# Patient Record
Sex: Male | Born: 1985 | Race: Black or African American | Hispanic: No | Marital: Single | Smoking: Current every day smoker
Health system: Southern US, Community
[De-identification: ages and names within clinical notes are randomized; demographics above are authoritative.]

## PROBLEM LIST (undated history)

## (undated) DIAGNOSIS — K219 Gastro-esophageal reflux disease without esophagitis: Secondary | ICD-10-CM

## (undated) HISTORY — DX: Gastro-esophageal reflux disease without esophagitis: K21.9

## (undated) HISTORY — PX: APPENDECTOMY: SHX54

---

## 2015-08-15 ENCOUNTER — Encounter (HOSPITAL_COMMUNITY): Payer: Self-pay | Admitting: Emergency Medicine

## 2015-08-15 ENCOUNTER — Ambulatory Visit (HOSPITAL_COMMUNITY)
Admission: EM | Admit: 2015-08-15 | Discharge: 2015-08-15 | Disposition: A | Discharge status: Home / Self Care | Payer: Self-pay | Attending: Emergency Medicine | Admitting: Emergency Medicine

## 2015-08-15 DIAGNOSIS — L0291 Cutaneous abscess, unspecified: Secondary | ICD-10-CM

## 2015-08-15 MED ORDER — SULFAMETHOXAZOLE-TRIMETHOPRIM 800-160 MG PO TABS: 1.0000 | ORAL_TABLET | Freq: Two times a day (BID) | ORAL | Status: AC

## 2015-08-15 NOTE — ED Notes (Signed)
Right calf with a bump, visible white top to bump, redness around bump.  Bump present for 4 days.

## 2015-08-15 NOTE — ED Provider Notes (Signed)
CSN: [id number]     Arrival date & time 08/15/15  1738 History   First MD Initiated Contact with Patient 08/15/15 1835     Chief Complaint  Patient presents with  . Abscess   (Consider location/radiation/quality/duration/timing/severity/associated sxs/prior Treatment) HPI History obtained from patient: 30 year old male states that he noted a red sore area on his right calf today. Concerned that he was bitten by a spider. Patient states that he works in [employer].. Currently has no pain. Patient describes the discomfort as a dull ache and it is constantly present. No home treatments. Associated fever or chills. Family history of hypertension.       History reviewed. No pertinent past medical history. Past Surgical History  Procedure Laterality Date  . Appendectomy     Family History  Problem Relation Age of Onset  . Hypertension Mother    Social History  Substance Use Topics  . Smoking status: Current Every Day Smoker  . Smokeless tobacco: None  . Alcohol Use: Yes    Review of Systems  Denies: HEADACHE, NAUSEA, ABDOMINAL PAIN, CHEST PAIN, CONGESTION, DYSURIA, SHORTNESS OF BREATH  Allergies  Review of patient's allergies indicates no known allergies.  Home Medications   Prior to Admission medications   Medication Sig Start Date End Date Taking? Authorizing Provider  sulfamethoxazole-trimethoprim (BACTRIM DS,SEPTRA DS) 800-160 MG tablet Take 1 tablet by mouth 2 (two) times daily. 08/15/15 08/22/15  Tharon AquasFrank C Charma Mocarski, PA   Meds Ordered and Administered this Visit  Medications - No data to display  BP 125/76 mmHg  Pulse 97  Temp(Src) 98.1 F (36.7 C) (Oral)  Resp 16  SpO2 99% No data found.   Physical Exam NURSES NOTES AND VITAL SIGNS REVIEWED. CONSTITUTIONAL: Well developed, well nourished, no acute distress HEENT: normocephalic, atraumatic EYES: Conjunctiva normal NECK:normal ROM, supple, no adenopathy PULMONARY:No respiratory distress, normal effort ABDOMINAL:  Soft, ND, NT BS+, No CVAT MUSCULOSKELETAL: Normal ROM of all extremities, Right calf is a small red 1 cm round lesion with a white head. It is nontender and there is no induration. SKIN: warm and dry without rash PSYCHIATRIC: Mood and affect, behavior are normal   ED Course  Procedures (including critical care time)  Labs Review Labs Reviewed - No data to display  Imaging Review No results found.   Visual Acuity Review  Right Eye Distance:   Left Eye Distance:   Bilateral Distance:    Right Eye Near:   Left Eye Near:    Bilateral Near:      Discussion with patient that it is very unlikely that he has been bitten by a brown recluse spider. Patient seemed to be a bit more relieved after this. Bactrim DS has been prescribed for his small abscess. He is also advised to apply warm compresses to the area and return to the urgent care at their new or worsening symptoms.   MDM   1. Abscess     Patient is reassured that there are no issues that require transfer to higher level of care at this time or additional tests. Patient is advised to continue home symptomatic treatment. Patient is advised that if there are new or worsening symptoms to attend the emergency department, contact primary care provider, or return to UC. Instructions of care provided discharged home in stable condition.    THIS NOTE WAS GENERATED USING A VOICE RECOGNITION SOFTWARE PROGRAM. ALL REASONABLE EFFORTS  WERE MADE TO PROOFREAD THIS DOCUMENT FOR ACCURACY.  I have verbally reviewed the discharge instructions with  the patient. A printed AVS was given to the patient.  All questions were answered prior to discharge.      Tharon AquasFrank C Arnett Galindez, PA 08/15/15 1943

## 2015-08-15 NOTE — Discharge Instructions (Signed)
Abscess °An abscess (boil or furuncle) is an infected area on or under the skin. This area is filled with yellowish-white fluid (pus) and other material (debris). °HOME CARE  °· Only take medicines as told by your doctor. °· If you were given antibiotic medicine, take it as directed. Finish the medicine even if you start to feel better. °· If gauze is used, follow your doctor's directions for changing the gauze. °· To avoid spreading the infection: °¨ Keep your abscess covered with a bandage. °¨ Wash your hands well. °¨ Do not share personal care items, towels, or whirlpools with others. °¨ Avoid skin contact with others. °· Keep your skin and clothes clean around the abscess. °· Keep all doctor visits as told. °GET HELP RIGHT AWAY IF:  °· You have more pain, puffiness (swelling), or redness in the wound site. °· You have more fluid or blood coming from the wound site. °· You have muscle aches, chills, or you feel sick. °· You have a fever. °MAKE SURE YOU:  °· Understand these instructions. °· Will watch your condition. °· Will get help right away if you are not doing well or get worse. °  °This information is not intended to replace advice given to you by your health care provider. Make sure you discuss any questions you have with your health care provider. °  °Document Released: 07/15/2007 Document Revised: 07/28/2011 Document Reviewed: 04/11/2011 °Elsevier Interactive Patient Education ©2016 [employer] ° °

## 2020-10-26 ENCOUNTER — Emergency Department (HOSPITAL_COMMUNITY)
Admission: EM | Admit: 2020-10-26 | Discharge: 2020-10-26 | Disposition: A | Discharge status: Home / Self Care | Payer: Self-pay | Attending: Emergency Medicine | Admitting: Emergency Medicine

## 2020-10-26 ENCOUNTER — Encounter (HOSPITAL_COMMUNITY): Payer: Self-pay | Admitting: Emergency Medicine

## 2020-10-26 ENCOUNTER — Other Ambulatory Visit: Payer: Self-pay

## 2020-10-26 DIAGNOSIS — F17200 Nicotine dependence, unspecified, uncomplicated: Secondary | ICD-10-CM | POA: Insufficient documentation

## 2020-10-26 DIAGNOSIS — R1010 Upper abdominal pain, unspecified: Secondary | ICD-10-CM | POA: Insufficient documentation

## 2020-10-26 MED ORDER — PANTOPRAZOLE SODIUM 20 MG PO TBEC: 20.0000 mg | DELAYED_RELEASE_TABLET | Freq: Every day | ORAL | 1 refills | Status: DC

## 2020-10-26 NOTE — ED Triage Notes (Signed)
PT c/o abdominal pain that reportedly mimics previous reflux. Reports taking tums with some relief. Denies V/D.

## 2020-10-26 NOTE — ED Provider Notes (Signed)
[facility]-EMERGENCY DEPT Provider Note   CSN: [id number] Arrival date & time: 10/26/20  1712     History Chief Complaint  Patient presents with   Abdominal Pain    Brett Montes is a 35 y.o. male.  Patient complains of abdominal pain.  Patient has a history of GERD but has not had his medicine in quite some time.  No vomiting no fever no chills  The history is provided by the patient and medical records. No language interpreter was used.  Abdominal Pain Pain location:  Epigastric Pain quality: aching   Pain radiates to:  Does not radiate Pain severity:  Moderate Onset quality:  Sudden Timing:  Intermittent Progression:  Waxing and waning Chronicity:  Recurrent Relieved by:  Nothing Worsened by:  Nothing Ineffective treatments:  None tried Associated symptoms: no chest pain, no cough, no diarrhea, no fatigue and no hematuria       History reviewed. No pertinent past medical history.  There are no problems to display for this patient.   Past Surgical History:  Procedure Laterality Date   APPENDECTOMY         Family History  Problem Relation Age of Onset   Hypertension Mother     Social History   Tobacco Use   Smoking status: Every Day  Substance Use Topics   Alcohol use: Yes   Drug use: No    Home Medications Prior to Admission medications   Medication Sig Start Date End Date Taking? Authorizing Provider  pantoprazole (PROTONIX) 20 MG tablet Take 1 tablet (20 mg total) by mouth daily. 10/26/20  Yes Bethann Berkshire, MD    Allergies    Patient has no known allergies.  Review of Systems   Review of Systems  Constitutional:  Negative for appetite change and fatigue.  HENT:  Negative for congestion, ear discharge and sinus pressure.   Eyes:  Negative for discharge.  Respiratory:  Negative for cough.   Cardiovascular:  Negative for chest pain.  Gastrointestinal:  Positive for abdominal pain. Negative for diarrhea.   Genitourinary:  Negative for frequency and hematuria.  Musculoskeletal:  Negative for back pain.  Skin:  Negative for rash.  Neurological:  Negative for seizures and headaches.  Psychiatric/Behavioral:  Negative for hallucinations.    Physical Exam Updated Vital Signs BP 114/81   Pulse 69   Temp 99 F (37.2 C)   Resp 15   SpO2 97%   Physical Exam Vitals and nursing note reviewed.  Constitutional:      Appearance: He is well-developed.  HENT:     Head: Normocephalic.     Nose: Nose normal.  Eyes:     General: No scleral icterus.    Conjunctiva/sclera: Conjunctivae normal.  Neck:     Thyroid: No thyromegaly.  Cardiovascular:     Rate and Rhythm: Normal rate and regular rhythm.     Heart sounds: No murmur heard.   No friction rub. No gallop.  Pulmonary:     Breath sounds: No stridor. No wheezing or rales.  Chest:     Chest wall: No tenderness.  Abdominal:     General: There is no distension.     Tenderness: There is abdominal tenderness. There is no rebound.  Musculoskeletal:        General: Normal range of motion.     Cervical back: Neck supple.  Lymphadenopathy:     Cervical: No cervical adenopathy.  Skin:    Findings: No erythema or rash.  Neurological:  Mental Status: He is alert and oriented to person, place, and time.     Motor: No abnormal muscle tone.     Coordination: Coordination normal.  Psychiatric:        Behavior: Behavior normal.    ED Results / Procedures / Treatments   Labs (all labs ordered are listed, but only abnormal results are displayed) Labs Reviewed - No data to display  EKG None  Radiology No results found.  Procedures Procedures   Medications Ordered in ED Medications - No data to display  ED Course  I have reviewed the triage vital signs and the nursing notes.  Pertinent labs & imaging results that were available during my care of the patient were reviewed by me and considered in my medical decision making (see  chart for details).    MDM Rules/Calculators/A&P                           Patient with GERD symptoms.  We will start him back on a protein pump inhibitor.  He will take Protonix once a day and follow-up with a primary care doctor Final Clinical Impression(s) / ED Diagnoses Final diagnoses:  Pain of upper abdomen    Rx / DC Orders ED Discharge Orders          Ordered    pantoprazole (PROTONIX) 20 MG tablet  Daily        10/26/20 2047             Bethann Berkshire, MD 10/26/20 2054

## 2020-10-26 NOTE — ED Provider Notes (Signed)
Emergency Medicine Provider Triage Evaluation Note  Brett Montes , a 35 y.o. male  was evaluated in triage.  Pt complains of abd pain.  Review of Systems  Positive: L side abd pain, reflux, acid taste in mouth Negative: Fever, cough, dysuria  Physical Exam  BP 120/81   Pulse 90   Temp 99 F (37.2 C)   Resp 18   SpO2 95%  Gen:   Awake, no distress   Resp:  Normal effort  MSK:   Moves extremities without difficulty  Other:    Medical Decision Making  Medically screening exam initiated at 5:50 PM.  Appropriate orders placed.  Jorgen Wolfinger was informed that the remainder of the evaluation will be completed by another provider, this initial triage assessment does not replace that evaluation, and the importance of remaining in the ED until their evaluation is complete.  Recurrent L side abd pain x 3 days.  Felt like reflux, does report some improvement with tums or with drinking milk.  Does admit to drinking alcohol.  Pain is mild at this time.    Fayrene Helper, PA-C 10/26/20 1751    Bethann Berkshire, MD 10/28/20 1705

## 2020-10-26 NOTE — Discharge Instructions (Addendum)
Follow-up with your family doctor in the next couple weeks for recheck.  You have also given some instructions on how to decrease the acid in your stomach

## 2020-10-26 NOTE — ED Notes (Signed)
Pt called for in ED lobby for room assignment, no answer x1. ENMiles 

## 2021-01-08 ENCOUNTER — Other Ambulatory Visit: Payer: Self-pay

## 2021-01-08 ENCOUNTER — Encounter (HOSPITAL_COMMUNITY): Payer: Self-pay

## 2021-01-08 ENCOUNTER — Emergency Department (HOSPITAL_COMMUNITY)
Admission: EM | Admit: 2021-01-08 | Discharge: 2021-01-08 | Disposition: A | Discharge status: Home / Self Care | Payer: Self-pay | Attending: Emergency Medicine | Admitting: Emergency Medicine

## 2021-01-08 DIAGNOSIS — F17200 Nicotine dependence, unspecified, uncomplicated: Secondary | ICD-10-CM | POA: Insufficient documentation

## 2021-01-08 DIAGNOSIS — K029 Dental caries, unspecified: Secondary | ICD-10-CM | POA: Insufficient documentation

## 2021-01-08 DIAGNOSIS — K0889 Other specified disorders of teeth and supporting structures: Secondary | ICD-10-CM

## 2021-01-08 MED ORDER — PENICILLIN V POTASSIUM 500 MG PO TABS: 500.0000 mg | ORAL_TABLET | Freq: Four times a day (QID) | ORAL | 0 refills | Status: AC

## 2021-01-08 NOTE — Discharge Instructions (Addendum)
Take antibiotics as prescribed to complete the full course.  Take ibuprofen and Tylenol for pain as directed.  Follow-up with a dentist, call to schedule an appointment or see resource list.

## 2021-01-08 NOTE — ED Triage Notes (Signed)
Pt reports dental pain that began about 2 days ago. Pt is not speaking due to pain.

## 2021-01-08 NOTE — ED Provider Notes (Signed)
[facility]-EMERGENCY DEPT Provider Note   CSN: [id number] Arrival date & time: 01/08/21  0636     History Chief Complaint  Patient presents with   Dental Pain    Brett Montes is a 35 y.o. male.  35 year old male with complaint of right upper dental pain x 1 week without fever, trauma, drainage.       History reviewed. No pertinent past medical history.  There are no problems to display for this patient.   Past Surgical History:  Procedure Laterality Date   APPENDECTOMY         Family History  Problem Relation Age of Onset   Hypertension Mother     Social History   Tobacco Use   Smoking status: Every Day  Substance Use Topics   Alcohol use: Yes   Drug use: No    Home Medications Prior to Admission medications   Medication Sig Start Date End Date Taking? Authorizing Provider  penicillin v potassium (VEETID) 500 MG tablet Take 1 tablet (500 mg total) by mouth 4 (four) times daily for 10 days. 01/08/21 01/18/21 Yes Jeannie Fend, PA-C  pantoprazole (PROTONIX) 20 MG tablet Take 1 tablet (20 mg total) by mouth daily. 10/26/20   Bethann Berkshire, MD    Allergies    Patient has no known allergies.  Review of Systems   Review of Systems  Constitutional:  Negative for fever.  HENT:  Positive for dental problem. Negative for ear pain, facial swelling, trouble swallowing and voice change.   Gastrointestinal:  Negative for nausea and vomiting.  Musculoskeletal:  Negative for neck pain.  Skin:  Negative for rash and wound.  Allergic/Immunologic: Negative for immunocompromised state.  Neurological:  Negative for headaches.  Hematological:  Negative for adenopathy.  Psychiatric/Behavioral:  Negative for confusion.   All other systems reviewed and are negative.  Physical Exam Updated Vital Signs BP 123/90 (BP Location: Left Arm)   Pulse 78   Temp 97.8 F (36.6 C) (Oral)   Resp 18   SpO2 98%   Physical Exam Vitals and nursing note  reviewed.  Constitutional:      General: He is not in acute distress.    Appearance: He is well-developed. He is not diaphoretic.  HENT:     Head: Normocephalic and atraumatic.     Jaw: No trismus.     Nose: Nose normal.     Mouth/Throat:     Mouth: Mucous membranes are moist.     Dentition: Dental tenderness and dental caries present. No dental abscesses.   Eyes:     Conjunctiva/sclera: Conjunctivae normal.  Pulmonary:     Effort: Pulmonary effort is normal.  Musculoskeletal:     Cervical back: Neck supple.  Lymphadenopathy:     Cervical: No cervical adenopathy.  Skin:    General: Skin is warm and dry.     Findings: No erythema or rash.  Neurological:     Mental Status: He is alert and oriented to person, place, and time.  Psychiatric:        Behavior: Behavior normal.    ED Results / Procedures / Treatments   Labs (all labs ordered are listed, but only abnormal results are displayed) Labs Reviewed - No data to display  EKG None  Radiology No results found.  Procedures Procedures   Medications Ordered in ED Medications - No data to display  ED Course  I have reviewed the triage vital signs and the nursing notes.  Pertinent  labs & imaging results that were available during my care of the patient were reviewed by me and considered in my medical decision making (see chart for details).  Clinical Course as of 01/08/21 0933  Wed Jan 08, 2021  5045 35 year old male with complaint of right upper dental pain as above.  Found to have a large cavity without obvious abscess.  Patient was given topical anesthetic while in ED, discharged with penicillin, recommend Motrin Tylenol.  Referred to dentist and given resource list. [LM]    Clinical Course User Index [LM] Alden Hipp   MDM Rules/Calculators/A&P                           Final Clinical Impression(s) / ED Diagnoses Final diagnoses:  Pain, dental    Rx / DC Orders ED Discharge Orders           Ordered    penicillin v potassium (VEETID) 500 MG tablet  4 times daily        01/08/21 0923             Jeannie Fend, PA-C 01/08/21 [id number]    Linwood Dibbles, MD 01/08/21 1827

## 2021-04-19 ENCOUNTER — Encounter (HOSPITAL_COMMUNITY): Payer: Self-pay

## 2021-04-19 ENCOUNTER — Emergency Department (HOSPITAL_COMMUNITY): Payer: Self-pay

## 2021-04-19 ENCOUNTER — Emergency Department (HOSPITAL_COMMUNITY)
Admission: EM | Admit: 2021-04-19 | Discharge: 2021-04-19 | Disposition: A | Discharge status: Home / Self Care | Payer: Self-pay | Attending: Emergency Medicine | Admitting: Emergency Medicine

## 2021-04-19 ENCOUNTER — Other Ambulatory Visit: Payer: Self-pay

## 2021-04-19 DIAGNOSIS — J3489 Other specified disorders of nose and nasal sinuses: Secondary | ICD-10-CM | POA: Insufficient documentation

## 2021-04-19 DIAGNOSIS — Z20822 Contact with and (suspected) exposure to covid-19: Secondary | ICD-10-CM | POA: Insufficient documentation

## 2021-04-19 DIAGNOSIS — J069 Acute upper respiratory infection, unspecified: Secondary | ICD-10-CM | POA: Insufficient documentation

## 2021-04-19 LAB — RESP PANEL BY RT-PCR (FLU A&B, COVID) ARPGX2
Influenza A by PCR: NEGATIVE
Influenza B by PCR: NEGATIVE
SARS Coronavirus 2 by RT PCR: NEGATIVE

## 2021-04-19 NOTE — ED Provider Notes (Cosign Needed)
[facility] EMERGENCY DEPARTMENT Provider Note   CSN: [id number] Arrival date & time: 04/19/21  2058     History  Chief Complaint  Patient presents with   Nasal Congestion    Brett Montes is a 36 y.o. male.  36 y.o male with no PMH presents to the ED with a chief complaint of non productive cough, runny nose and nasal congestion x 1 week. 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  He reports a dry cough, also some nasal congestion along with some postnasal drip.  He has tried over-the-counter medication without any improvement in symptoms.  He does endorse history of smoking, smokes "a few cigarettes "a day.  Denies any COVID-19, influenza vaccinations.  No fever, no chills, no other complaints.  The history is provided by the patient and medical records.      Home Medications Prior to Admission medications   Medication Sig Start Date End Date Taking? Authorizing Provider  pantoprazole (PROTONIX) 20 MG tablet Take 1 tablet (20 mg total) by mouth daily. 10/26/20   Bethann Berkshire, MD      Allergies    Patient has no known allergies.    Review of Systems   Review of Systems  Constitutional:  Negative for chills and fever.  Respiratory:  Positive for cough. Negative for shortness of breath.    Physical Exam Updated Vital Signs BP 129/89 (BP Location: Right Arm)    Pulse 77    Temp 98.5 F (36.9 C) (Oral)    Resp 20    Ht 5\' 8"  (1.727 m)    Wt 72.6 kg    SpO2 98%    BMI 24.33 kg/m  Physical Exam Vitals and nursing note reviewed.  Constitutional:      Appearance: Normal appearance.  HENT:     Head: Normocephalic and atraumatic.     Nose: Rhinorrhea present.     Mouth/Throat:     Mouth: Mucous membranes are moist.     Pharynx: No oropharyngeal exudate or posterior oropharyngeal erythema.  Cardiovascular:     Rate and Rhythm: Normal rate.  Pulmonary:     Effort: Pulmonary effort is normal.     Breath  sounds: No wheezing.  Abdominal:     General: Abdomen is flat.  Musculoskeletal:     Cervical back: Normal range of motion and neck supple.  Skin:    General: Skin is warm and dry.  Neurological:     Mental Status: He is alert and oriented to person, place, and time.    ED Results / Procedures / Treatments   Labs (all labs ordered are listed, but only abnormal results are displayed) Labs Reviewed  RESP PANEL BY RT-PCR (FLU A&B, COVID) [id number]    EKG None  Radiology DG Chest 2 View  Result Date: 04/19/2021 CLINICAL DATA:  Coughing and congestion.  History of tobacco use. EXAM: CHEST - 2 VIEW COMPARISON:  None. FINDINGS: The heart size and mediastinal contours are within normal limits. Both lungs are clear. The visualized skeletal structures are unremarkable. IMPRESSION: No active cardiopulmonary disease. Electronically Signed   By: 06/19/2021 M.D.   On: 04/19/2021 23:15    Procedures Procedures    Medications Ordered in ED Medications - No data to display  ED Course/ Medical Decision Making/ A&P  Medical Decision Making Amount and/or Complexity of Data Reviewed Radiology: ordered.   Patient presents to the ED with upper respiratory infection, cough, nasal congestion for the past week.  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  He does report a history of smoking, smoking a few cigarettes a day.  During my evaluation he is hemodynamically stable, afebrile, satting at 98% on room air.  Lungs are clear to auscultation without any wheezing,, rales.  Oropharynx is clear without any exudates, or erythema noted.  There is some nasal congestion noted, but without any sinus pressure.  We discussed chest x-ray due to his lungs and history of smoking, COVID-19 swab to rule out any infection.  Patient is agreeable to follow-up with respiratory panel.  Patient hemodynamically  stable for discharge.  Portions of this note were generated with Scientist, clinical (histocompatibility and immunogenetics). Dictation errors may occur despite best attempts at proofreading.   Final Clinical Impression(s) / ED Diagnoses Final diagnoses:  Viral URI with cough    Rx / DC Orders ED Discharge Orders     None         Claude Manges, PA-C 04/19/21 2326

## 2021-04-19 NOTE — ED Triage Notes (Addendum)
Nonproductive cough, runny nose and nasal congestion x 1 week. Denies fevers or chills.  ? ?Refused viral swab says he doesn't think it is that.  ?

## 2021-04-19 NOTE — Discharge Instructions (Addendum)
Your xray results were within normal limits. ? ?You will need to continue taking over the counter medication, to help with your cough and congestion.  ? ?We obtained a respiratory panel which will return today, follow up via mychart. ?

## 2021-06-17 ENCOUNTER — Emergency Department (HOSPITAL_COMMUNITY)
Admission: EM | Admit: 2021-06-17 | Discharge: 2021-06-18 | Disposition: A | Discharge status: Home / Self Care | Payer: Self-pay | Attending: Emergency Medicine | Admitting: Emergency Medicine

## 2021-06-17 ENCOUNTER — Encounter (HOSPITAL_COMMUNITY): Payer: Self-pay

## 2021-06-17 ENCOUNTER — Other Ambulatory Visit: Payer: Self-pay

## 2021-06-17 DIAGNOSIS — K029 Dental caries, unspecified: Secondary | ICD-10-CM | POA: Insufficient documentation

## 2021-06-17 DIAGNOSIS — K0381 Cracked tooth: Secondary | ICD-10-CM | POA: Insufficient documentation

## 2021-06-17 NOTE — ED Triage Notes (Signed)
Pt reports he is here today due to right side tooth pain.He reports he was eating his food this evening and then felt like his tooth chipped  ?

## 2021-06-18 MED ORDER — NAPROXEN 500 MG PO TABS: 500.0000 mg | ORAL_TABLET | Freq: Two times a day (BID) | ORAL | 0 refills | Status: AC

## 2021-06-18 MED ORDER — HYDROCODONE-ACETAMINOPHEN 5-325 MG PO TABS
1.0000 | ORAL_TABLET | Freq: Once | ORAL | Status: AC
  Administered 2021-06-18: 1 via ORAL
  Filled 2021-06-18: qty 1

## 2021-06-18 MED ORDER — KETOROLAC TROMETHAMINE 30 MG/ML IJ SOLN
30.0000 mg | Freq: Once | INTRAMUSCULAR | Status: DC
  Filled 2021-06-18: qty 1

## 2021-06-18 NOTE — ED Provider Notes (Signed)
?[facility] EMERGENCY DEPARTMENT ?Provider Note ? ? ?CSN: [id number] ?Arrival date & time: 06/17/21  1910 ? ?  ? ?History ? ?Chief Complaint  ?Patient presents with  ? Dental Pain  ? ? ?Brett Montes is a 36 y.o. male. ? ?HPI ? ?  ? ?This is a 36 year old male who presents with dental pain.  Patient reports that he was eating earlier tonight when he thinks that he chipped his tooth.  He since has had pain.  He took ibuprofen with minimal relief.  No recent dental work.  He does not have a Education officer, community. ? ?Home Medications ?Prior to Admission medications   ?Medication Sig Start Date End Date Taking? Authorizing Provider  ?naproxen (NAPROSYN) 500 MG tablet Take 1 tablet (500 mg total) by mouth 2 (two) times daily. 06/18/21  Yes Madysun Thall, Mayer Masker, MD  ?pantoprazole (PROTONIX) 20 MG tablet Take 1 tablet (20 mg total) by mouth daily. 10/26/20   Bethann Berkshire, MD  ?   ? ?Allergies    ?Patient has no known allergies.   ? ?Review of Systems   ?Review of Systems  ?HENT:  Positive for dental problem.   ?All other systems reviewed and are negative. ? ?Physical Exam ?Updated Vital Signs ?BP 104/68 (BP Location: Left Arm)   Pulse 80   Temp 99.7 ?F (37.6 ?C) (Oral)   Resp 16   SpO2 96%  ?Physical Exam ?Vitals and nursing note reviewed.  ?Constitutional:   ?   Appearance: He is well-developed. He is not ill-appearing.  ?HENT:  ?   Head: Normocephalic and atraumatic.  ?   Mouth/Throat:  ?   Comments: Fracture noted of tooth #15 down to the root, multiple dental caries ?Eyes:  ?   Pupils: Pupils are equal, round, and reactive to light.  ?Cardiovascular:  ?   Rate and Rhythm: Normal rate and regular rhythm.  ?Pulmonary:  ?   Effort: Pulmonary effort is normal. No respiratory distress.  ?Abdominal:  ?   Palpations: Abdomen is soft.  ?   Tenderness: There is no abdominal tenderness.  ?Musculoskeletal:  ?   Cervical back: Neck supple.  ?Lymphadenopathy:  ?   Cervical: No cervical adenopathy.  ?Skin: ?   General: Skin is  warm and dry.  ?Neurological:  ?   Mental Status: He is alert and oriented to person, place, and time.  ?Psychiatric:     ?   Mood and Affect: Mood normal.  ? ? ?ED Results / Procedures / Treatments   ?Labs ?(all labs ordered are listed, but only abnormal results are displayed) ?Labs Reviewed - No data to display ? ?EKG ?None ? ?Radiology ?No results found. ? ?Procedures ?Procedures  ? ? ?Medications Ordered in ED ?Medications  ?ketorolac (TORADOL) 30 MG/ML injection 30 mg (has no administration in time range)  ?HYDROcodone-acetaminophen (NORCO/VICODIN) 5-325 MG per tablet 1 tablet (has no administration in time range)  ? ? ?ED Course/ Medical Decision Making/ A&P ?  ?                        ?Medical Decision Making ?Risk ?Prescription drug management. ? ? ?This patient presents to the ED for concern of dental pain, this involves an extensive number of treatment options, and is a complaint that carries with it a high risk of complications and morbidity.  The differential diagnosis includes caries, dental infection, dental fracture ? ?MDM:   ? ?Patient presents with dental pain.  Onset after  likely fracturing a tooth while eating.  He has significant dental caries.  Fracture likely related to caries.  No obvious infectious source.  Patient given Toradol and hydrocodone.  We discussed scheduled naproxen at home.  He was provided with dental follow-up. ?Admission considered for N/A ?(Labs, imaging, consults) ? ?Labs: ?I Ordered, and personally interpreted labs.  The pertinent results include: N/A ? ?Imaging Studies ordered: ?I ordered imaging studies including a ?I independently visualized and interpreted imaging. ?I agree with the radiologist interpretation ? ?Additional history obtained from chart review.  External records from outside source obtained and reviewed including evaluation ? ?Cardiac Monitoring: ?The patient was maintained on a cardiac monitor.  I personally viewed and interpreted the cardiac monitored  which showed an underlying rhythm of: Sinus rhythm ? ?Reevaluation: ?After the interventions noted above, I reevaluated the patient and found that they have :improved ? ?Social Determinants of Health: ?Lives independently ? ?Disposition: Discharge ? ?Co morbidities that complicate the patient evaluation ?History reviewed. No pertinent past medical history.  ? ?Medicines ?Meds ordered this encounter  ?Medications  ? ketorolac (TORADOL) 30 MG/ML injection 30 mg  ? HYDROcodone-acetaminophen (NORCO/VICODIN) 5-325 MG per tablet 1 tablet  ? naproxen (NAPROSYN) 500 MG tablet  ?  Sig: Take 1 tablet (500 mg total) by mouth 2 (two) times daily.  ?  Dispense:  30 tablet  ?  Refill:  0  ?  ?I have reviewed the patients home medicines and have made adjustments as needed ? ?Problem List / ED Course: ?Problem List Items Addressed This Visit   ?None ?Visit Diagnoses   ? ? Dental caries    -  Primary  ? Nontraumatic broken or cracked tooth      ? ?  ?  ? ? ? ? ? ? ? ? ? ? ? ? ?Final Clinical Impression(s) / ED Diagnoses ?Final diagnoses:  ?Dental caries  ?Nontraumatic broken or cracked tooth  ? ? ?Rx / DC Orders ?ED Discharge Orders   ? ?      Ordered  ?  naproxen (NAPROSYN) 500 MG tablet  2 times daily       ? 06/18/21 0345  ? ?  ?  ? ?  ? ? ?  ?Shon Baton, MD ?06/18/21 [phone] ? ?

## 2021-06-18 NOTE — Discharge Instructions (Signed)
You were seen today for cracked tooth.  This is likely related to underlying dental caries.  Follow-up with dentistry.  You will need to have dental work.  Take naproxen as needed for pain. ?

## 2021-10-14 ENCOUNTER — Encounter (HOSPITAL_COMMUNITY): Payer: Self-pay | Admitting: Emergency Medicine

## 2021-10-14 ENCOUNTER — Emergency Department (HOSPITAL_COMMUNITY)
Admission: EM | Admit: 2021-10-14 | Discharge: 2021-10-14 | Disposition: A | Discharge status: Home / Self Care | Payer: Self-pay | Attending: Emergency Medicine | Admitting: Emergency Medicine

## 2021-10-14 ENCOUNTER — Other Ambulatory Visit: Payer: Self-pay

## 2021-10-14 DIAGNOSIS — R1013 Epigastric pain: Secondary | ICD-10-CM | POA: Insufficient documentation

## 2021-10-14 LAB — CBC WITH DIFFERENTIAL/PLATELET
Abs Immature Granulocytes: 0.03 10*3/uL (ref 0.00–0.07)
Basophils Absolute: 0.1 10*3/uL (ref 0.0–0.1)
Basophils Relative: 1 %
Eosinophils Absolute: 0.1 10*3/uL (ref 0.0–0.5)
Eosinophils Relative: 1 %
HCT: 48.5 % (ref 39.0–52.0)
Hemoglobin: 15.9 g/dL (ref 13.0–17.0)
Immature Granulocytes: 0 %
Lymphocytes Relative: 25 %
Lymphs Abs: 2.1 10*3/uL (ref 0.7–4.0)
MCH: 30.8 pg (ref 26.0–34.0)
MCHC: 32.8 g/dL (ref 30.0–36.0)
MCV: 94 fL (ref 80.0–100.0)
Monocytes Absolute: 0.4 10*3/uL (ref 0.1–1.0)
Monocytes Relative: 5 %
Neutro Abs: 5.6 10*3/uL (ref 1.7–7.7)
Neutrophils Relative %: 68 %
Platelets: 248 10*3/uL (ref 150–400)
RBC: 5.16 MIL/uL (ref 4.22–5.81)
RDW: 15.8 % — ABNORMAL HIGH (ref 11.5–15.5)
WBC: 8.3 10*3/uL (ref 4.0–10.5)
nRBC: 0 % (ref 0.0–0.2)

## 2021-10-14 LAB — COMPREHENSIVE METABOLIC PANEL
ALT: 15 U/L (ref 0–44)
AST: 17 U/L (ref 15–41)
Albumin: 4.1 g/dL (ref 3.5–5.0)
Alkaline Phosphatase: 73 U/L (ref 38–126)
Anion gap: 9 (ref 5–15)
BUN: 10 mg/dL (ref 6–20)
CO2: 24 mmol/L (ref 22–32)
Calcium: 9.3 mg/dL (ref 8.9–10.3)
Chloride: 106 mmol/L (ref 98–111)
Creatinine, Ser: 0.92 mg/dL (ref 0.61–1.24)
GFR, Estimated: >60 mL/min (ref >60)
Glucose, Bld: 96 mg/dL (ref 70–99)
Potassium: 3.4 mmol/L — ABNORMAL LOW (ref 3.5–5.1)
Sodium: 139 mmol/L (ref 135–145)
Total Bilirubin: 0.6 mg/dL (ref 0.3–1.2)
Total Protein: 7 g/dL (ref 6.5–8.1)

## 2021-10-14 LAB — LIPASE, BLOOD: Lipase: 27 U/L (ref 11–51)

## 2021-10-14 MED ORDER — ALUM & MAG HYDROXIDE-SIMETH 200-200-20 MG/5ML PO SUSP
30.0000 mL | Freq: Once | ORAL | Status: AC
  Administered 2021-10-14: 30 mL via ORAL
  Filled 2021-10-14: qty 30

## 2021-10-14 MED ORDER — FAMOTIDINE 20 MG PO TABS: 20.0000 mg | ORAL_TABLET | Freq: Every day | ORAL | 0 refills | Status: AC

## 2021-10-14 MED ORDER — PANTOPRAZOLE SODIUM 20 MG PO TBEC: 20.0000 mg | DELAYED_RELEASE_TABLET | Freq: Every day | ORAL | 0 refills | Status: AC

## 2021-10-14 MED ORDER — SODIUM CHLORIDE 0.9 % IV BOLUS: 500.0000 mL | Freq: Once | INTRAVENOUS | Status: DC

## 2021-10-14 MED ORDER — FAMOTIDINE IN NACL 20-0.9 MG/50ML-% IV SOLN: 20.0000 mg | Freq: Once | INTRAVENOUS | Status: DC

## 2021-10-14 NOTE — ED Provider Notes (Signed)
[facility] EMERGENCY DEPARTMENT Provider Note   CSN: [id number] Arrival date & time: 10/14/21  9417     History  Chief Complaint  Patient presents with   Gastric Reflux / Bloated    Brett Montes is a 36 y.o. male. With a history of GERD who presents to the ED with epigastric burning pain and bloating since 9 pm yesterday.  He reports eating pizza for dinner last night approximately 1 hour prior to symptom onset, walking around the house for a few minutes, then laying down for bed.  States he was not able to sleep due to the burning pain.  Pain is constant but pain level varies every few minutes.  Reports pain can reach up to a 10 out of 10.  Reports relief with semirecumbent positioning and drinking water.  Denies radiation, pain is localized to the epigastric region and does not spread to anywhere else in the abdomen.  Denies vomiting, states that he spits up because he does not want to swallow any more acid.  Denies nausea, chest pain, shortness of breath, diarrhea, fevers, chills.  Patient is prescribed Protonix at home but has not taken it for months.  HPI     Home Medications Prior to Admission medications   Medication Sig Start Date End Date Taking? Authorizing Provider  naproxen (NAPROSYN) 500 MG tablet Take 1 tablet (500 mg total) by mouth 2 (two) times daily. 06/18/21   Horton, Mayer Masker, MD  pantoprazole (PROTONIX) 20 MG tablet Take 1 tablet (20 mg total) by mouth daily. 10/26/20   Bethann Berkshire, MD      Allergies    Patient has no known allergies.    Review of Systems   Review of Systems  Constitutional:  Negative for appetite change, chills, diaphoresis and fever.  Respiratory:  Negative for cough and shortness of breath.   Cardiovascular:  Negative for chest pain.  Gastrointestinal:  Positive for abdominal distention and abdominal pain. Negative for constipation, diarrhea, nausea and vomiting.  Genitourinary:  Negative for dysuria.  All other  systems reviewed and are negative.   Physical Exam Updated Vital Signs BP (!) 124/92 (BP Location: Right Arm)   Pulse 78   Temp 97.9 F (36.6 C) (Oral)   Resp 18   SpO2 100%  Physical Exam Vitals and nursing note reviewed.  Constitutional:      General: He is not in acute distress.    Appearance: Normal appearance. He is normal weight. He is not ill-appearing, toxic-appearing or diaphoretic.  HENT:     Head: Normocephalic and atraumatic.  Eyes:     General: No scleral icterus.    Pupils: Pupils are equal, round, and reactive to light.  Cardiovascular:     Rate and Rhythm: Normal rate and regular rhythm.     Pulses: Normal pulses.     Heart sounds: Normal heart sounds. No murmur heard. Pulmonary:     Effort: Pulmonary effort is normal. No respiratory distress.     Breath sounds: Normal breath sounds. No wheezing.  Abdominal:     General: Abdomen is flat. Bowel sounds are normal. There is no distension.     Palpations: Abdomen is soft.     Tenderness: There is no abdominal tenderness. There is no guarding.  Musculoskeletal:        General: Normal range of motion.     Cervical back: Neck supple.     Right lower leg: No edema.     Left lower leg: No  edema.  Skin:    General: Skin is warm and dry.     Capillary Refill: Capillary refill takes less than 2 seconds.  Neurological:     General: No focal deficit present.     Mental Status: He is alert and oriented to person, place, and time.  Psychiatric:        Mood and Affect: Mood normal.        Behavior: Behavior normal.     ED Results / Procedures / Treatments   Labs (all labs ordered are listed, but only abnormal results are displayed) Labs Reviewed  CBC WITH DIFFERENTIAL/PLATELET - Abnormal; Notable for the following components:      Result Value   RDW 15.8 (*)    All other components within normal limits  COMPREHENSIVE METABOLIC PANEL - Abnormal; Notable for the following components:   Potassium 3.4 (*)    All  other components within normal limits  LIPASE, BLOOD  URINALYSIS, ROUTINE W REFLEX MICROSCOPIC    EKG None  Radiology No results found.  Procedures Procedures    Medications Ordered in ED Medications  alum & mag hydroxide-simeth (MAALOX/MYLANTA) 200-200-20 MG/5ML suspension 30 mL (30 mLs Oral Given 10/14/21 3664)    ED Course/ Medical Decision Making/ A&P Clinical Course as of 10/14/21 0917  Tue Oct 14, 2021  0858 Upon reevaluation, patient states that his pain has completely resolved after GI cocktail and he does not want to stay for IV Pepcid and fluids. [AS]    Clinical Course User Index [AS] Elantra Caprara, Edsel Petrin, PA-C                           Medical Decision Making Amount and/or Complexity of Data Reviewed Labs: ordered.  Risk OTC drugs.  This patient presents to the ED for concern of epigastric burning pain, this involves an extensive number of treatment options, and is a complaint that carries with it a high risk of complications and morbidity.  The differential diagnosis for abdominal pain includes, but is not limited to AAA, gastroenteritis, appendicitis, Bowel obstruction, Bowel perforation. Gastroparesis, DKA, Hernia, Inflammatory bowel disease, mesenteric ischemia, pancreatitis, peritonitis SBP, volvulus, GERD   Co morbidities that complicate the patient evaluation  GERD  My initial workup includes basic labs and GI cocktail  Additional history obtained from: Nursing notes from this visit.  I ordered, reviewed and interpreted labs which include: CBC, CMP, lipase. All labs within normal limits  Afebrile, hemodynamically stable.  Patient is well-appearing and comfortable on exam.  Patient reported full resolution of his symptoms after GI cocktail.  I offered IV Pepcid and fluids, patient declined these and stated he would rather go home since his symptoms have resolved.  CBC, CMP, lipase all within normal limits.  Pain resolved with GI cocktail.  I have low  suspicion for any cardiac or pulmonary emergencies.  Pain localized to the epigastric region without radiation, history and physical exam correlate with dyspepsia.  I will send patient home prescription for Protonix that he should take daily until he can follow-up with his primary care provider.  I have also sent a prescription for Pepcid that he may take when he feels symptoms.  Stable at discharge.   At this time there does not appear to be any evidence of an acute emergency medical condition and the patient appears stable for discharge with appropriate outpatient follow up. Diagnosis was discussed with patient who verbalizes understanding of care plan and is  agreeable to discharge. I have discussed return precautions with patient who verbalizes understanding. Patient encouraged to follow-up with their PCP within 1 week. All questions answered.  Patient's case discussed with Dr. Jeraldine Loots who agrees with plan to discharge with follow-up.   Note: Portions of this report may have been transcribed using voice recognition software. Every effort was made to ensure accuracy; however, inadvertent computerized transcription errors may still be present.         Final Clinical Impression(s) / ED Diagnoses Final diagnoses:  None    Rx / DC Orders ED Discharge Orders     None         Michelle Piper, Cordelia Poche 10/14/21 2951    Gerhard Munch, MD 10/15/21 1027

## 2021-10-14 NOTE — Discharge Instructions (Addendum)
You have been seen today for your complaint of GERD symptoms. Your lab work was reassuring and showed no abnormalities. Your discharge medications include Protonix. This is a stomach acid stabilizer that should be taken every day to prevent GERD symptoms. I am also sending you a prescription for Pepcid. This should be taken when you feel symptoms coming on.  Both of these medications are also available over-the-counter without a prescription. Home care instructions are as follows:  You should stop smoking.  You should avoid taking a lot of NSAIDs.  You should avoid spicy or acidic foods.  You should avoid lying flat for at least 30 minutes after eating. Follow up with: Your primary care provider in 1 week. Please seek immediate medical care if you develop any of the following symptoms: You have sudden pain in your arms, neck, jaw, teeth, or back. You suddenly feel sweaty, dizzy, or light-headed. You have chest pain or shortness of breath. You vomit and the vomit is green, yellow, or black, or it looks like blood or coffee grounds. You faint. You have stool that is red, bloody, or black. You cannot swallow, drink, or eat. At this time there does not appear to be the presence of an emergent medical condition, however there is always the potential for conditions to change. Please read and follow the below instructions.  Do not take your medicine if  develop an itchy rash, swelling in your mouth or lips, or difficulty breathing; call 911 and seek immediate emergency medical attention if this occurs.  You may review your lab tests and imaging results in their entirety on your MyChart account.  Please discuss all results of fully with your primary care provider and other specialist at your follow-up visit.  Note: Portions of this text may have been transcribed using voice recognition software. Every effort was made to ensure accuracy; however, inadvertent computerized transcription errors may still be  present.

## 2021-10-14 NOTE — ED Triage Notes (Signed)
Patient reports acid reflux with abdominal bloating this morning with emesis .

## 2022-09-11 IMAGING — CR DG CHEST 2V
2 series · 2 of 2 positions shown · non-contrast
Comparison: None.

CLINICAL DATA: Coughing and congestion.  History of tobacco use.

EXAM:
CHEST - 2 VIEW

[chest pa]
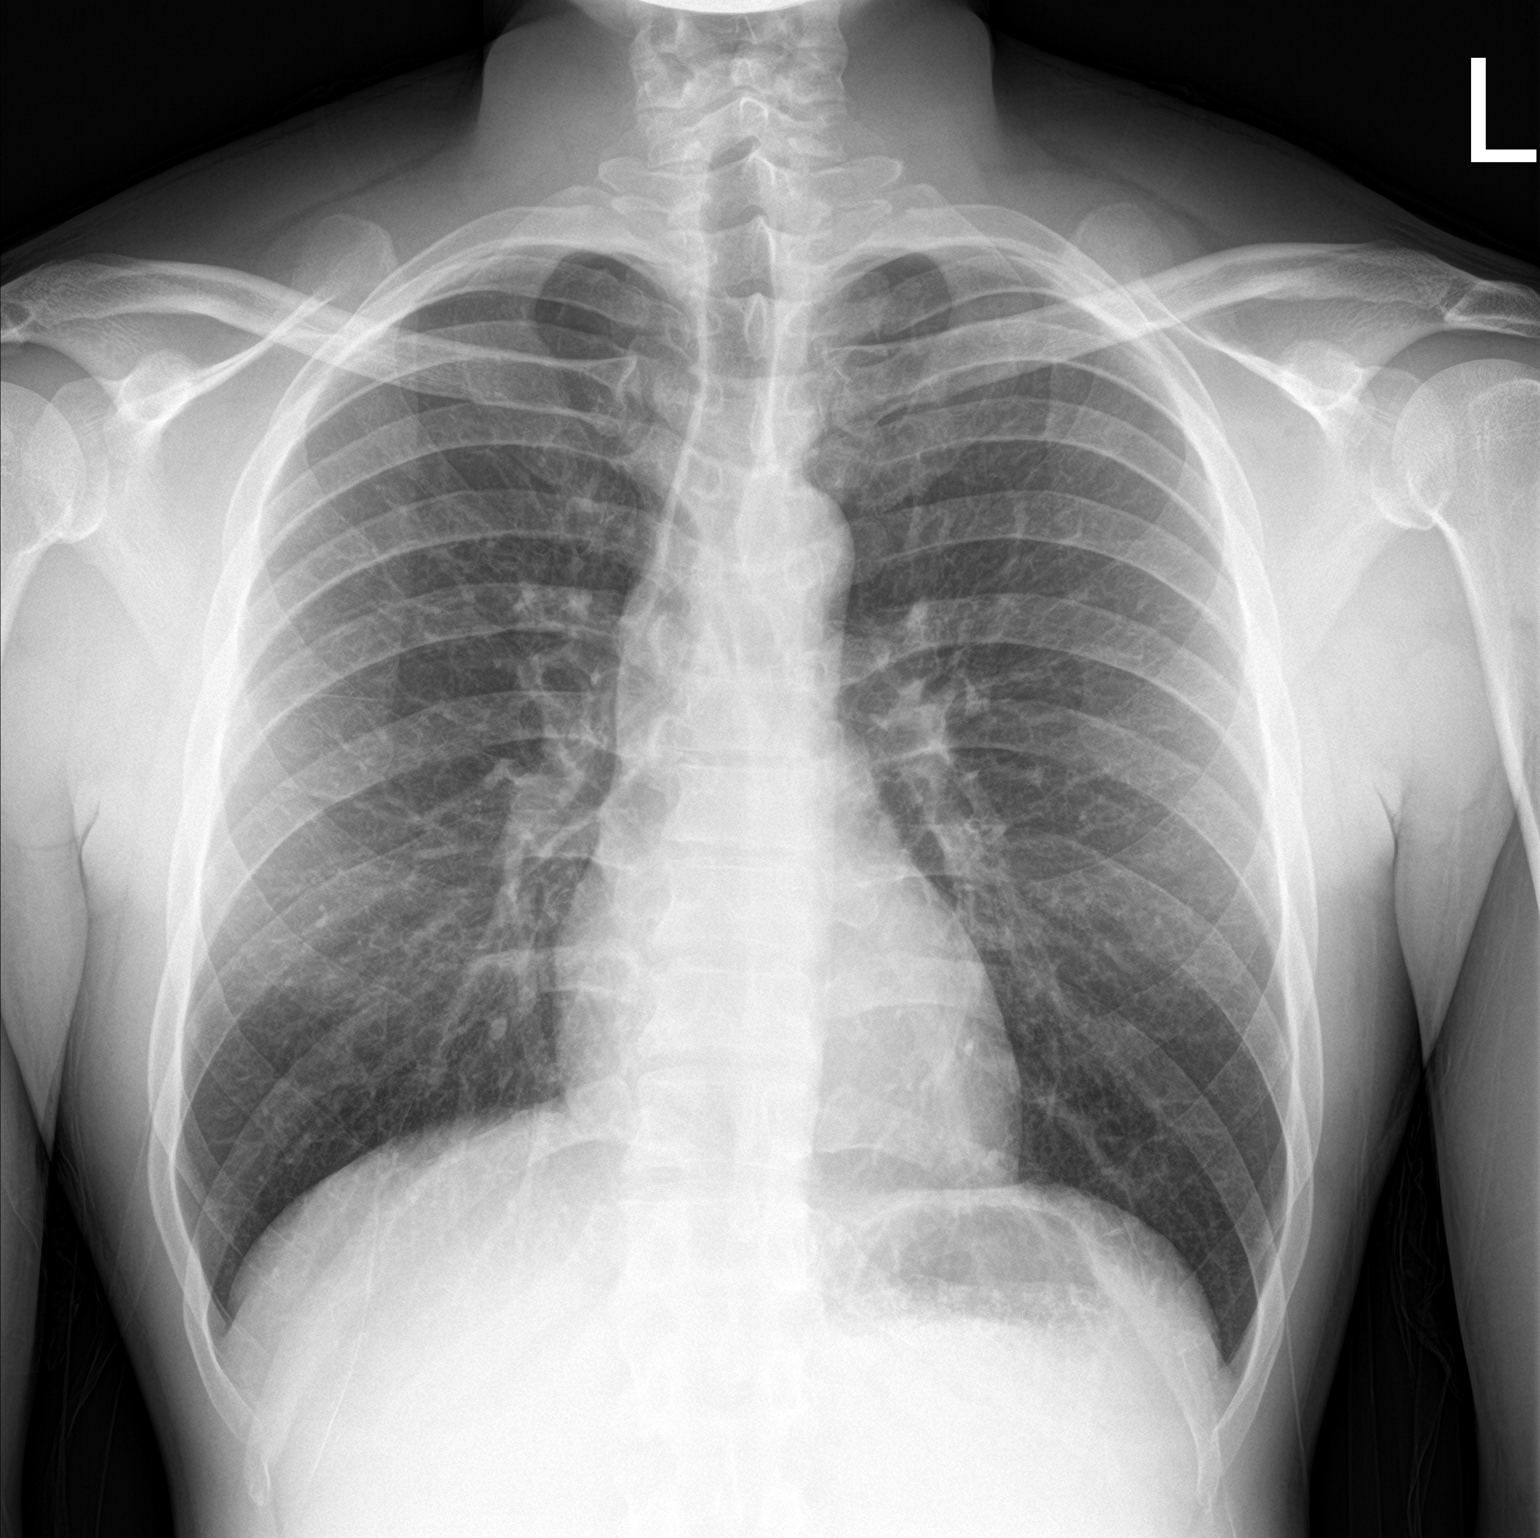

[chest lat]
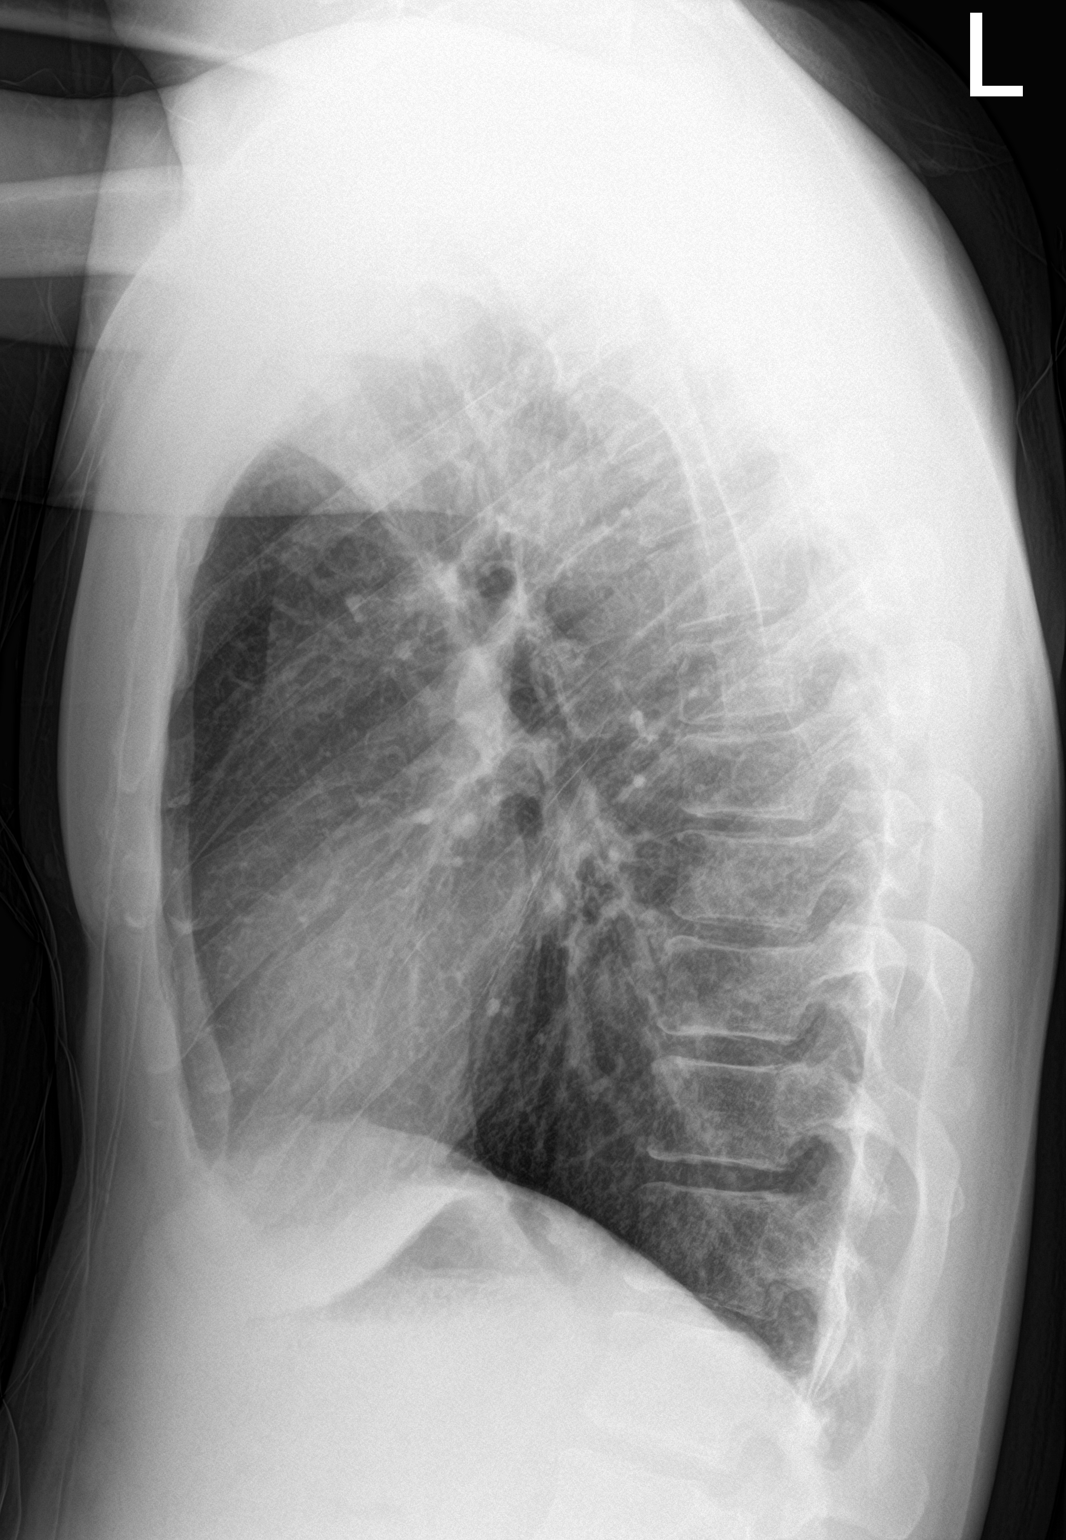

[2 of 2 positions shown; findings below may reference images not displayed]

FINDINGS: The heart size and mediastinal contours are within normal limits.
Both lungs are clear. The visualized skeletal structures are
unremarkable.
IMPRESSION: No active cardiopulmonary disease.
